# Patient Record
Sex: Male | Born: 1990 | Race: Black or African American | Hispanic: No | Marital: Single | Smoking: Never smoker
Health system: Southern US, Community
[De-identification: ages and names within clinical notes are randomized; demographics above are authoritative.]

---

## 2018-02-27 ENCOUNTER — Ambulatory Visit (HOSPITAL_COMMUNITY)
Admission: EM | Admit: 2018-02-27 | Discharge: 2018-02-27 | Disposition: A | Discharge status: Home / Self Care | Payer: BLUE CROSS/BLUE SHIELD | Attending: Family Medicine | Admitting: Family Medicine

## 2018-02-27 ENCOUNTER — Other Ambulatory Visit: Payer: Self-pay

## 2018-02-27 ENCOUNTER — Encounter (HOSPITAL_COMMUNITY): Payer: Self-pay

## 2018-02-27 DIAGNOSIS — M25511 Pain in right shoulder: Secondary | ICD-10-CM

## 2018-02-27 DIAGNOSIS — M7541 Impingement syndrome of right shoulder: Secondary | ICD-10-CM

## 2018-02-27 MED ORDER — NAPROXEN 500 MG PO TABS: 500.0000 mg | ORAL_TABLET | Freq: Two times a day (BID) | ORAL | 0 refills | Status: DC
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
## 2018-02-27 NOTE — Discharge Instructions (Signed)
Rest, ice and heat as needed Ensure adequate ROM as tolerated. Injuries all appear to be muscular in nature. Prescribed naproxen as needed for inflammation and pain relief Expect some increased pain in the next 1-3 days.  It may take 3-4 weeks for complete resolution of symptoms Will f/u with his doctor or here if not seeing significant improvement within one week. Return here or go to ER if you have any new or worsening symptoms such as numbness/tingling of the inner thighs, loss of bladder or bowel control, headache/blurry vision, nausea/vomiting, confusion/altered mental status, dizziness, weakness, passing out, imbalance, etc...Marland Kitchen

## 2018-04-10 ENCOUNTER — Other Ambulatory Visit: Payer: Self-pay

## 2018-04-10 ENCOUNTER — Ambulatory Visit (HOSPITAL_COMMUNITY)
Admission: EM | Admit: 2018-04-10 | Discharge: 2018-04-10 | Disposition: A | Discharge status: Home / Self Care | Payer: BLUE CROSS/BLUE SHIELD | Attending: Family Medicine | Admitting: Family Medicine

## 2018-04-10 ENCOUNTER — Encounter (HOSPITAL_COMMUNITY): Payer: Self-pay | Admitting: Family Medicine

## 2018-04-10 ENCOUNTER — Ambulatory Visit (INDEPENDENT_AMBULATORY_CARE_PROVIDER_SITE_OTHER): Payer: BLUE CROSS/BLUE SHIELD

## 2018-04-10 DIAGNOSIS — S46911D Strain of unspecified muscle, fascia and tendon at shoulder and upper arm level, right arm, subsequent encounter: Secondary | ICD-10-CM | POA: Diagnosis not present

## 2018-04-10 MED ORDER — PREDNISONE 50 MG PO TABS: ORAL_TABLET | ORAL | 0 refills | Status: AC

## 2018-04-10 NOTE — Discharge Instructions (Signed)
X-rays are normal  Your pain is consistent with a rotator cuff injury.  If the medicine provided does not give you relief, please call the physical therapist noted below.
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
## 2019-12-03 IMAGING — DX DG SHOULDER 2+V*R*
4 series · 4 of 4 positions shown · non-contrast
Comparison: None.

CLINICAL DATA: Right shoulder tenderness

EXAM:
RIGHT SHOULDER - 2+ VIEW

[shoulder ap]
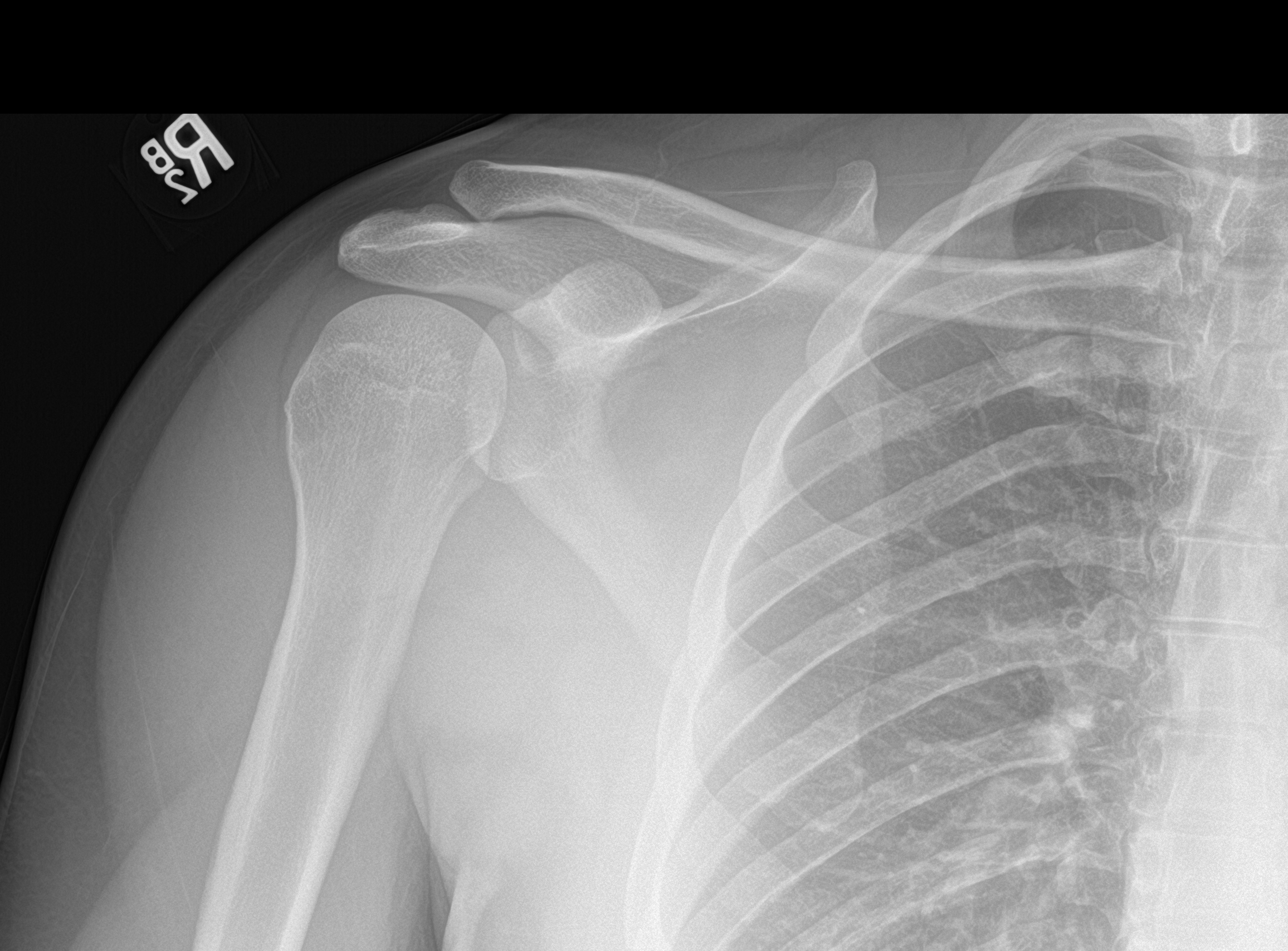

[shoulder grashey]
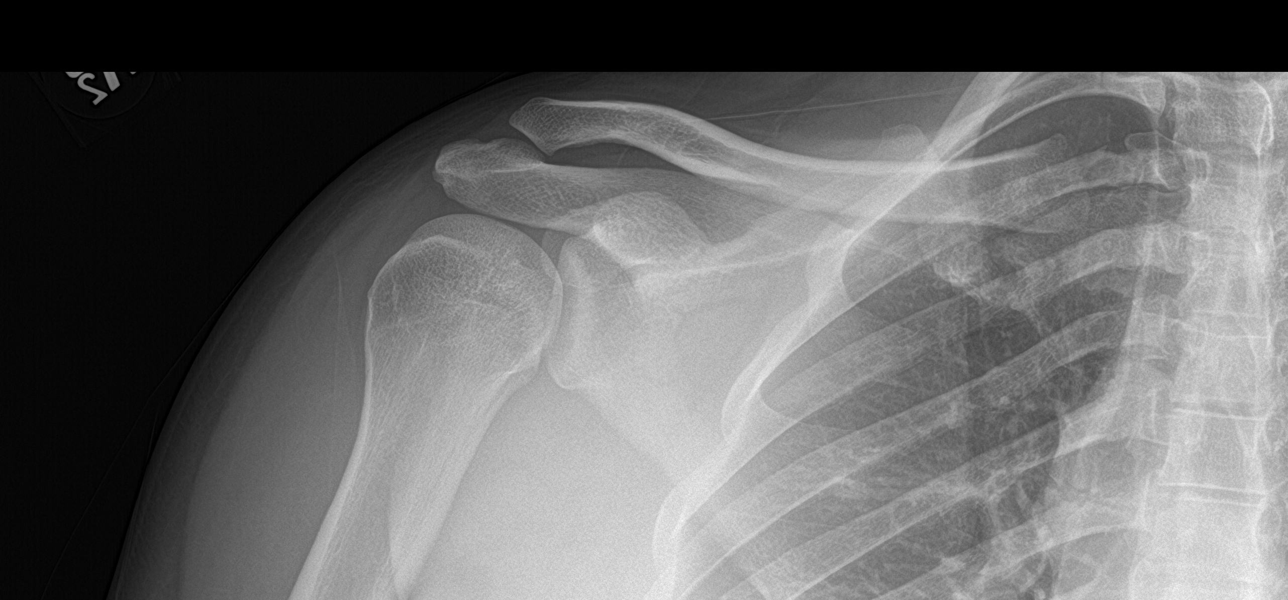

[shoulder y-view]
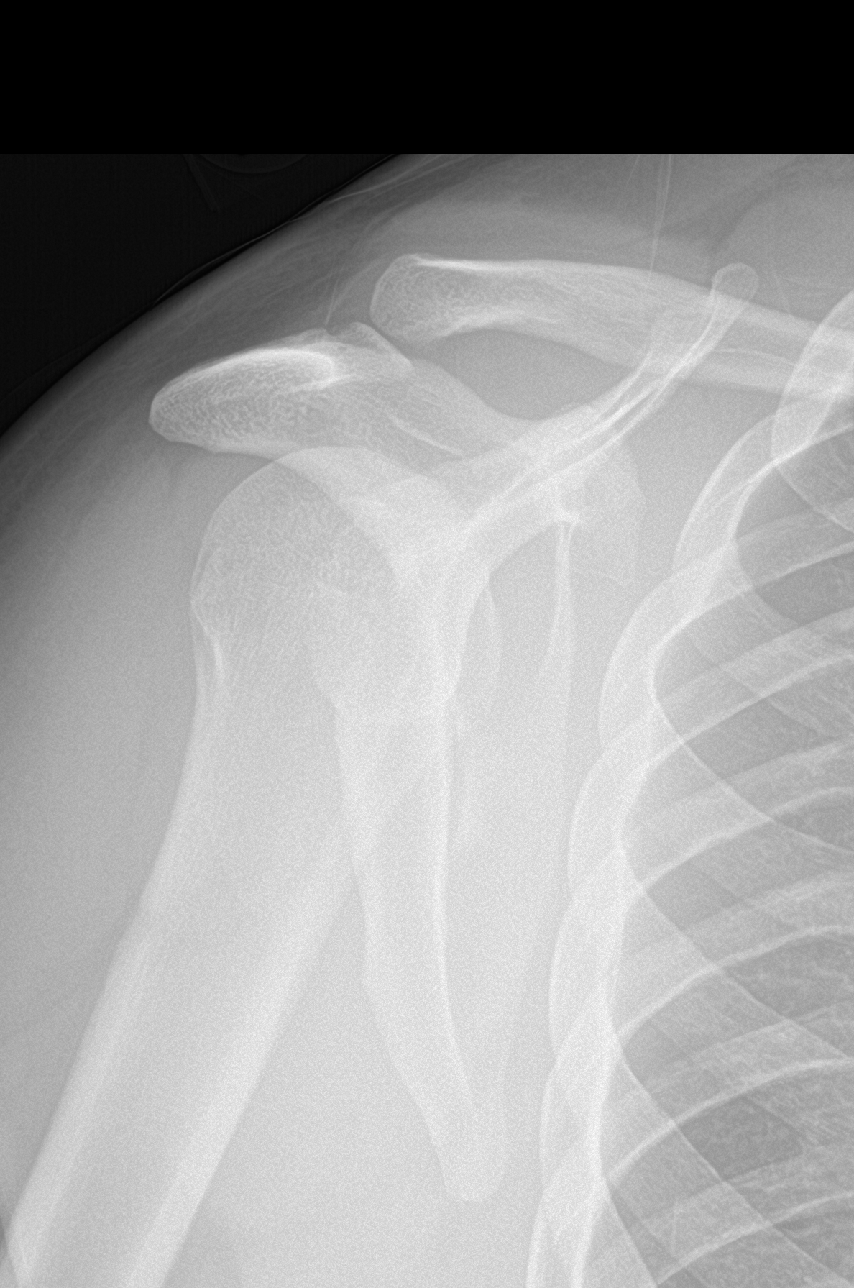

[shoulder axial]
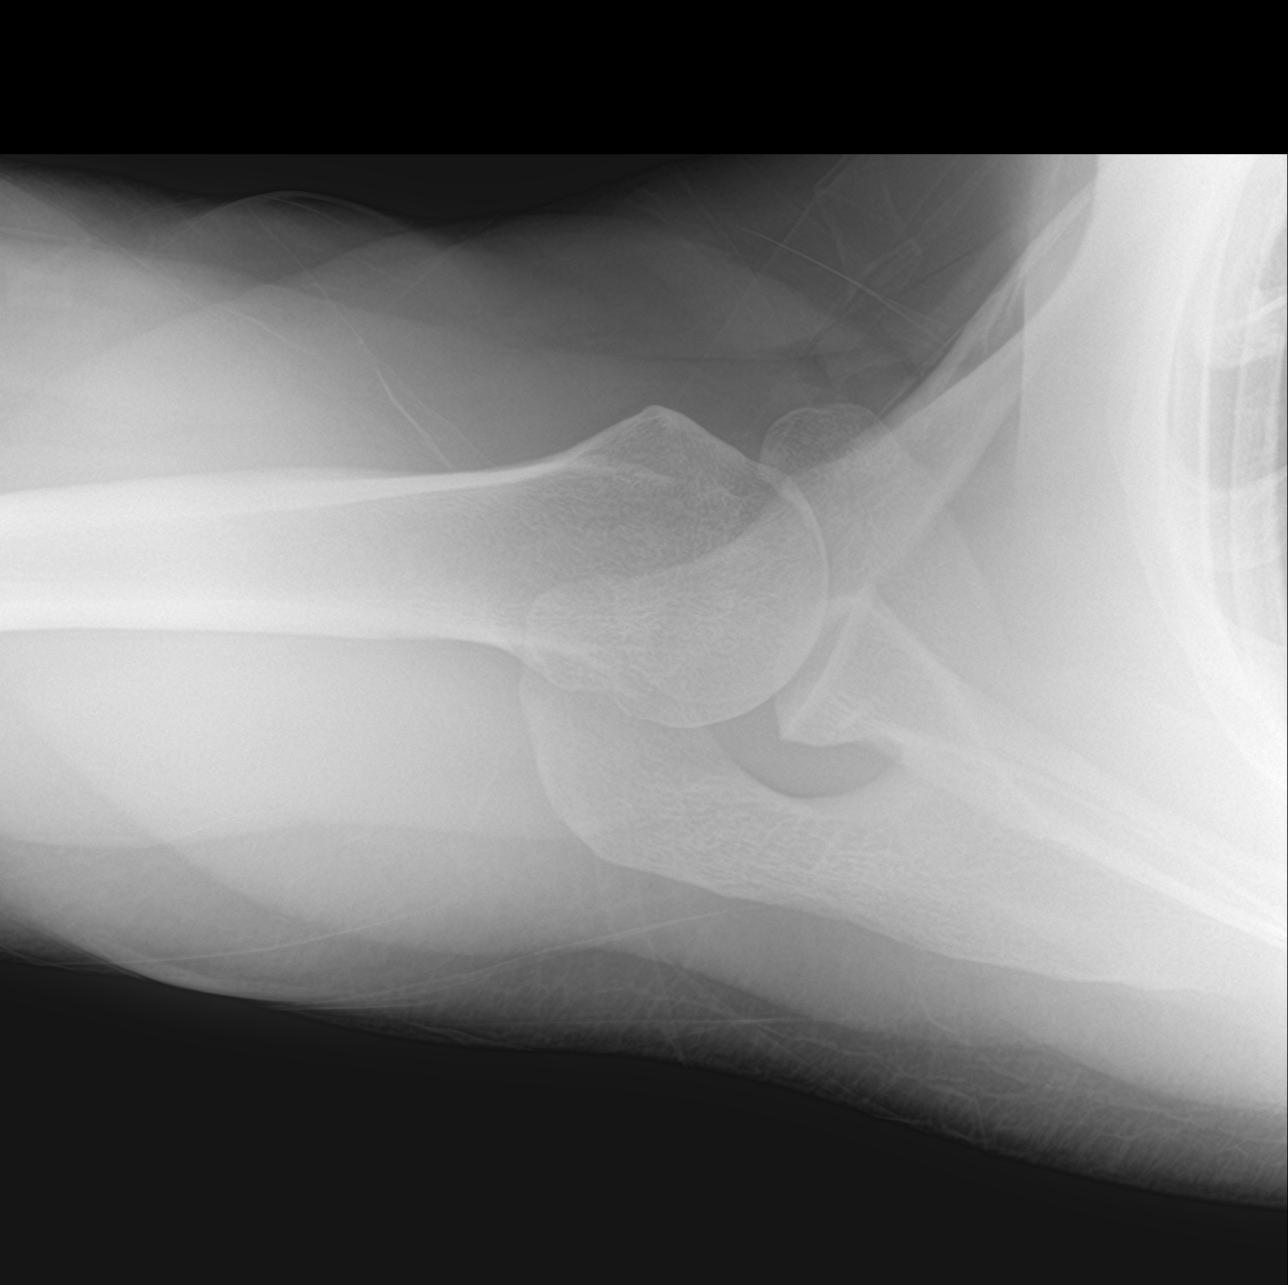

[4 of 4 positions shown; findings below may reference images not displayed]

FINDINGS: There is no evidence of fracture or dislocation. There is no
evidence of arthropathy or other focal bone abnormality. Soft
tissues are unremarkable.
IMPRESSION: Negative.
